# Patient Record
Sex: Female | Born: 1990 | Race: White | Hispanic: No | Marital: Single | Smoking: Never smoker
Health system: Southern US, Community
[De-identification: ages and names within clinical notes are randomized; demographics above are authoritative.]

---
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
## 2018-11-10 ENCOUNTER — Other Ambulatory Visit (HOSPITAL_COMMUNITY)
Admission: RE | Admit: 2018-11-10 | Discharge: 2018-11-10 | Disposition: A | Discharge status: Home / Self Care | Payer: Self-pay | Source: Ambulatory Visit | Attending: Family Medicine | Admitting: Family Medicine

## 2018-11-10 ENCOUNTER — Other Ambulatory Visit: Payer: Self-pay | Admitting: Family Medicine

## 2018-11-10 DIAGNOSIS — Z124 Encounter for screening for malignant neoplasm of cervix: Secondary | ICD-10-CM | POA: Insufficient documentation

## 2018-11-16 LAB — CYTOLOGY - PAP
Diagnosis: UNDETERMINED — AB
HPV: NOT DETECTED

## 2019-05-17 ENCOUNTER — Other Ambulatory Visit: Payer: Self-pay | Admitting: Family Medicine

## 2019-05-17 DIAGNOSIS — N6320 Unspecified lump in the left breast, unspecified quadrant: Secondary | ICD-10-CM

## 2019-05-25 ENCOUNTER — Other Ambulatory Visit: Payer: Self-pay

## 2019-11-18 ENCOUNTER — Other Ambulatory Visit (HOSPITAL_COMMUNITY)
Admission: RE | Admit: 2019-11-18 | Discharge: 2019-11-18 | Disposition: A | Discharge status: Home / Self Care | Payer: BLUE CROSS/BLUE SHIELD | Source: Ambulatory Visit | Attending: Family Medicine | Admitting: Family Medicine

## 2019-11-18 ENCOUNTER — Other Ambulatory Visit: Payer: Self-pay | Admitting: Family Medicine

## 2019-11-18 DIAGNOSIS — Z124 Encounter for screening for malignant neoplasm of cervix: Secondary | ICD-10-CM | POA: Insufficient documentation

## 2019-11-23 LAB — CYTOLOGY - PAP
Comment: NEGATIVE
Diagnosis: NEGATIVE
High risk HPV: NEGATIVE

## 2020-10-01 ENCOUNTER — Ambulatory Visit: Payer: Self-pay | Admitting: Surgery

## 2020-10-01 NOTE — H&P (Signed)
Savannah Campbell Appointment: 10/06/20 9:40 AM Location: [facility] Patient #: [id number] DOB: 1991-05-27 Married / Language: Savannah Campbell / Race: White Female  History of Present Illness Savannah Fus A. Deya Bigos MD; 2020-10-06 12:33 PM) Patient words: Patient presents for evaluation of gallstones. She was sent at the request of Dr. Corliss Blacker for evaluation of right flank and occasional right upper quadrant abdominal pain. The patient has had more consistent right flank pain and pain around to her scapula off and on. It is mild to moderate in intensity lasting minutes but no more than a few hours at worst. It comes and goes but is getting more frequent. No history of food intolerance or being made worse by eating. She does have a lot of bloating though and is seen her PCP for evaluation of this. No nausea, vomiting, fever or chills. No evidence of jaundice or yellowing of the eyes or skin.  The patient is a 30 year old female.   Past Surgical History Savannah Campbell, [place]; 10/06/2020 9:41 AM) No pertinent past surgical history  Diagnostic Studies History Savannah Campbell, [place]; 10/06/2020 9:41 AM) Colonoscopy never Mammogram within last year Pap Smear 1-5 years ago  Social History Savannah Campbell, [place]; Oct 06, 2020 9:41 AM) Alcohol use Occasional alcohol use. Caffeine use Coffee, Tea. No drug use Tobacco use Never smoker.  Family History Savannah Campbell, [place]; 2020/10/06 9:41 AM) Alcohol Abuse Family Members In General. Arthritis Father. Cerebrovascular Accident Family Members In General. Diabetes Mellitus Family Members In General. Hypertension Family Members In General. Melanoma Family Members In General. Migraine Headache Father, Mother, Sister. Thyroid problems Mother, Sister.  Pregnancy / Birth History Savannah Campbell, [place]; Oct 06, 2020 9:41 AM) Age at menarche 10 years. Contraceptive History Intrauterine device, Oral contraceptives. Gravida 2 Length (months) of breastfeeding >4 Maternal  age 78-25 Para 2 Regular periods  Other Problems Savannah Campbell, CMA; 10/06/2020 9:41 AM) Back Pain Cholelithiasis Migraine Headache Other disease, cancer, significant illness Thyroid Disease     Review of Systems Savannah Campbell CMA; 06-Oct-2020 9:41 AM) General Present- Fatigue. Not Present- Appetite Loss, Chills, Fever, Night Sweats, Weight Gain and Weight Loss. Skin Present- Dryness and New Lesions. Not Present- Change in Wart/Mole, Hives, Jaundice, Non-Healing Wounds, Rash and Ulcer. HEENT Present- Seasonal Allergies and Wears glasses/contact lenses. Not Present- Earache, Hearing Loss, Hoarseness, Nose Bleed, Oral Ulcers, Ringing in the Ears, Sinus Pain, Sore Throat, Visual Disturbances and Yellow Eyes. Respiratory Not Present- Bloody sputum, Chronic Cough, Difficulty Breathing, Snoring and Wheezing. Breast Not Present- Breast Mass, Breast Pain, Nipple Discharge and Skin Changes. Cardiovascular Not Present- Chest Pain, Difficulty Breathing Lying Down, Leg Cramps, Palpitations, Rapid Heart Rate, Shortness of Breath and Swelling of Extremities. Gastrointestinal Present- Bloating. Not Present- Abdominal Pain, Bloody Stool, Change in Bowel Habits, Chronic diarrhea, Constipation, Difficulty Swallowing, Excessive gas, Gets full quickly at meals, Hemorrhoids, Indigestion, Nausea, Rectal Pain and Vomiting. Female Genitourinary Not Present- Frequency, Nocturia, Painful Urination, Pelvic Pain and Urgency. Musculoskeletal Present- Back Pain and Joint Pain. Not Present- Joint Stiffness, Muscle Pain, Muscle Weakness and Swelling of Extremities. Neurological Present- Headaches. Not Present- Decreased Memory, Fainting, Numbness, Seizures, Tingling, Tremor, Trouble walking and Weakness. Psychiatric Not Present- Anxiety, Bipolar, Change in Sleep Pattern, Depression, Fearful and Frequent crying. Endocrine Not Present- Cold Intolerance, Excessive Hunger, Hair Changes, Heat Intolerance, Hot flashes and New  Diabetes. Hematology Not Present- Blood Thinners, Easy Bruising, Excessive bleeding, Gland problems, HIV and Persistent Infections.  Vitals Savannah Campbell CMA; 2020-10-06 9:42 AM) 2020-10-06 9:41 AM Weight: 197.38 lb Height: 67in Body Surface Area:  2.01 m Body Mass Index: 30.91 kg/m  Temp.: 42F  Pulse: 94 (Regular)  P.OX: 98% (Room air) BP: 118/68(Sitting, Left Arm, Standard)        Physical Exam (Savannah Szczesniak A. Bashar Milam MD; 10/01/2020 12:33 PM)  General Mental Status-Alert. General Appearance-Consistent with stated age. Hydration-Well hydrated. Voice-Normal.  Head and Neck Head-normocephalic, atraumatic with no lesions or palpable masses.  Eye Eyeball - Bilateral-Extraocular movements intact. Sclera/Conjunctiva - Bilateral-No scleral icterus.  Chest and Lung Exam Chest and lung exam reveals -quiet, even and easy respiratory effort with no use of accessory muscles and on auscultation, normal breath sounds, no adventitious sounds and normal vocal resonance. Inspection Chest Wall - Normal. Back - normal.  Cardiovascular Cardiovascular examination reveals -on palpation PMI is normal in location and amplitude, no palpable S3 or S4. Normal cardiac borders., normal heart sounds, regular rate and rhythm with no murmurs, carotid auscultation reveals no bruits and normal pedal pulses bilaterally.  Abdomen Inspection Inspection of the abdomen reveals - No Hernias. Skin - Scar - no surgical scars. Palpation/Percussion Palpation and Percussion of the abdomen reveal - Soft, Non Tender, No Rebound tenderness, No Rigidity (guarding) and No hepatosplenomegaly. Auscultation Auscultation of the abdomen reveals - Bowel sounds normal.  Neurologic Neurologic evaluation reveals -alert and oriented x 3 with no impairment of recent or remote memory. Mental Status-Normal.  Musculoskeletal Normal Exam - Left-Upper Extremity Strength Normal and Lower Extremity Strength  Normal. Normal Exam - Right-Upper Extremity Strength Normal, Lower Extremity Weakness.    Assessment & Plan (Savannah Berkovich A. Phoenyx Melka MD; 10/01/2020 12:34 PM)  SYMPTOMATIC CHOLELITHIASIS (K80.20) Impression: She becomes symptomatic. Her symptoms are mild. I discussed laparoscopic cholecystectomy with her with the pros and cons. I explained that surgery could be done at a later time depending on #feels that she could be managed with dietary restrictions or consider surgery since she is symptomatic with a 1.6 and her gallstone ULTRASOUND. HER LIVER FUNCTION IS NORMAL AND HER COMMON BILE DUCT IS 3 MM ON HER MOST RECENT ULTRASOUND. SHE WOULD LIKE TO PROCEED WITH SURGERY SINCE HER MOTHER HAD ACUTE CHOLECYSTITIS AND A PROLONGED HOSPITAL STAY. The procedure has been discussed with the patient. Risks of laparoscopic cholecystectomy include bleeding, infection, bile duct injury, leak, death, open surgery, diarrhea, other surgery, organ injury, blood vessel injury, DVT, and additional care.   Total time 30 minutes  Current Plans You are being scheduled for surgery- Our schedulers will call you.  You should hear from our office's scheduling department within 5 working days about the location, date, and time of surgery. We try to make accommodations for patient's preferences in scheduling surgery, but sometimes the OR schedule or the surgeon's schedule prevents [place] from making those accommodations.  If you have not heard from our office [phone]) in 5 working days, call the office and ask for your surgeon's nurse.  If you have other questions about your diagnosis, plan, or surgery, call the office and ask for your surgeon's nurse.  Pt Education - Laparoscopic Cholecystectomy: gallbladder Pt Education - [facility] Good Bowel Health Pt Education - [facility] Laparosopic Post Op HCI The anatomy & physiology of hepatobiliary & pancreatic function was discussed. The pathophysiology of gallbladder dysfunction was  discussed. Natural history risks without surgery was discussed. I feel the risks of no intervention will lead to serious problems that outweigh the operative risks; therefore, I recommended cholecystectomy to remove the pathology. I explained laparoscopic techniques with possible need for an open approach. Probable cholangiogram to evaluate the bilary tract was  explained as well.  Risks such as bleeding, infection, abscess, leak, injury to other organs, need for further treatment, heart attack, death, and other risks were discussed. I noted a good likelihood this will help address the problem. Possibility that this will not correct all abdominal symptoms was explained. Goals of post-operative recovery were discussed as well. We will work to minimize complications. An educational handout further explaining the pathology and treatment options was given as well. Questions were answered. The patient expresses understanding & wishes to proceed with surgery.

## 2021-06-27 ENCOUNTER — Other Ambulatory Visit: Payer: Self-pay | Admitting: Family Medicine

## 2021-06-27 DIAGNOSIS — R1011 Right upper quadrant pain: Secondary | ICD-10-CM

## 2021-06-28 ENCOUNTER — Ambulatory Visit
Admission: RE | Admit: 2021-06-28 | Discharge: 2021-06-28 | Disposition: A | Discharge status: Home / Self Care | Payer: BLUE CROSS/BLUE SHIELD | Source: Ambulatory Visit | Attending: Family Medicine | Admitting: Family Medicine

## 2021-06-28 DIAGNOSIS — R1011 Right upper quadrant pain: Secondary | ICD-10-CM

## 2021-12-04 ENCOUNTER — Other Ambulatory Visit: Payer: Self-pay | Admitting: Surgery

## 2022-06-04 ENCOUNTER — Other Ambulatory Visit: Payer: Self-pay | Admitting: Family Medicine

## 2022-06-04 DIAGNOSIS — K760 Fatty (change of) liver, not elsewhere classified: Secondary | ICD-10-CM

## 2022-06-24 ENCOUNTER — Other Ambulatory Visit: Payer: BLUE CROSS/BLUE SHIELD

## 2022-07-09 ENCOUNTER — Inpatient Hospital Stay: Admission: RE | Admit: 2022-07-09 | Payer: BLUE CROSS/BLUE SHIELD | Source: Ambulatory Visit

## 2022-11-27 ENCOUNTER — Other Ambulatory Visit: Payer: Self-pay | Admitting: Family Medicine

## 2022-11-27 ENCOUNTER — Ambulatory Visit
Admission: RE | Admit: 2022-11-27 | Discharge: 2022-11-27 | Disposition: A | Discharge status: Home / Self Care | Payer: BLUE CROSS/BLUE SHIELD | Source: Ambulatory Visit | Attending: Family Medicine | Admitting: Family Medicine

## 2022-11-27 ENCOUNTER — Encounter: Payer: Self-pay | Admitting: Family Medicine

## 2022-11-27 DIAGNOSIS — Z872 Personal history of diseases of the skin and subcutaneous tissue: Secondary | ICD-10-CM

## 2023-03-30 IMAGING — US US ABDOMEN LIMITED
1 series · 13 of 25 positions shown · non-contrast
Comparison: September 19, 2020.

CLINICAL DATA: A 30-year-old female presents with RIGHT upper
quadrant pain for 2 days.

EXAM:
ULTRASOUND ABDOMEN LIMITED RIGHT UPPER QUADRANT

[Series 1: us abdomen limited · 0.25mm/px · 13 of 52 slices shown]
[im 1/52]
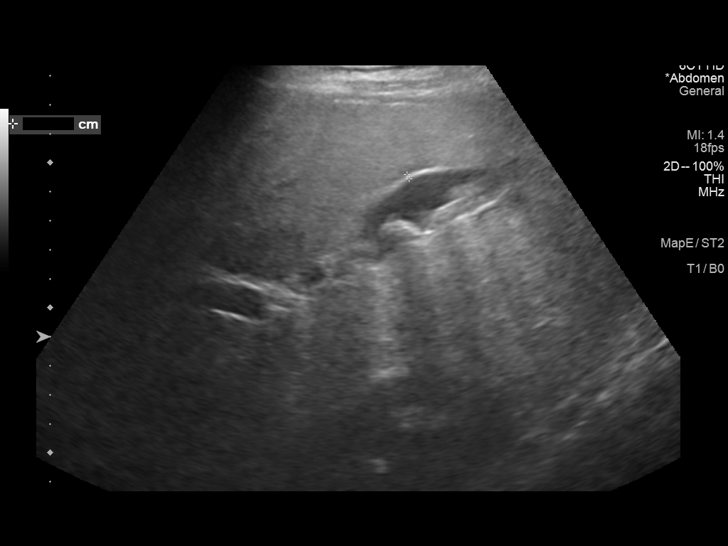
[im 5/52]
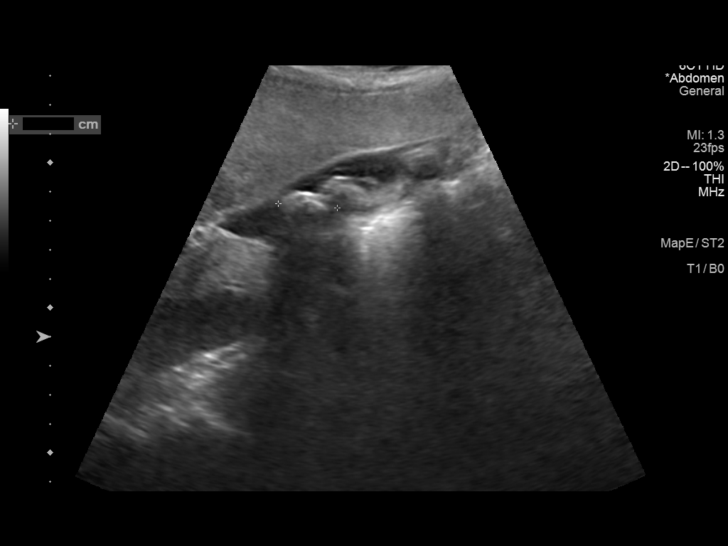
[im 9/52]
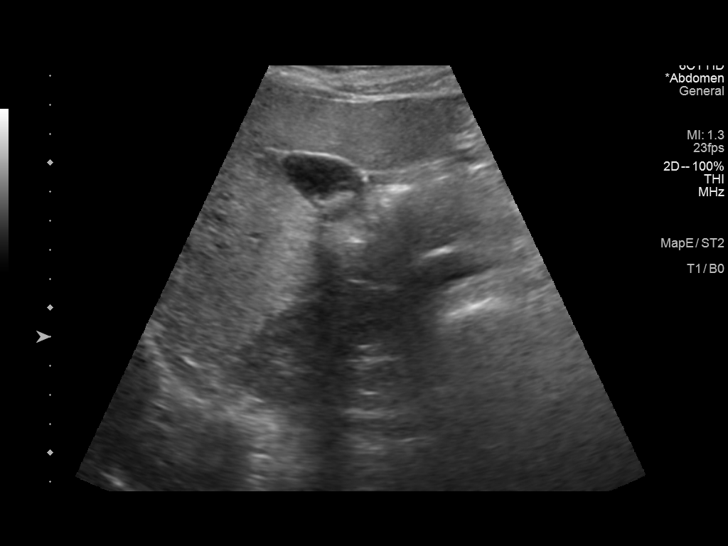
[im 13/52]
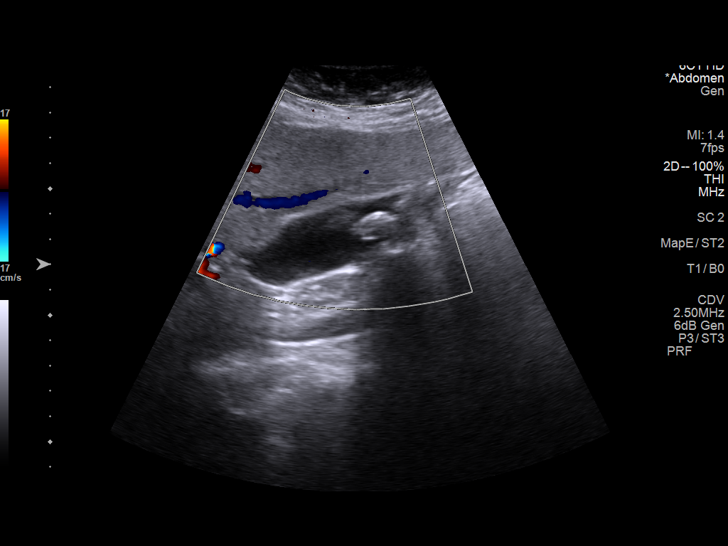
[im 18/52]
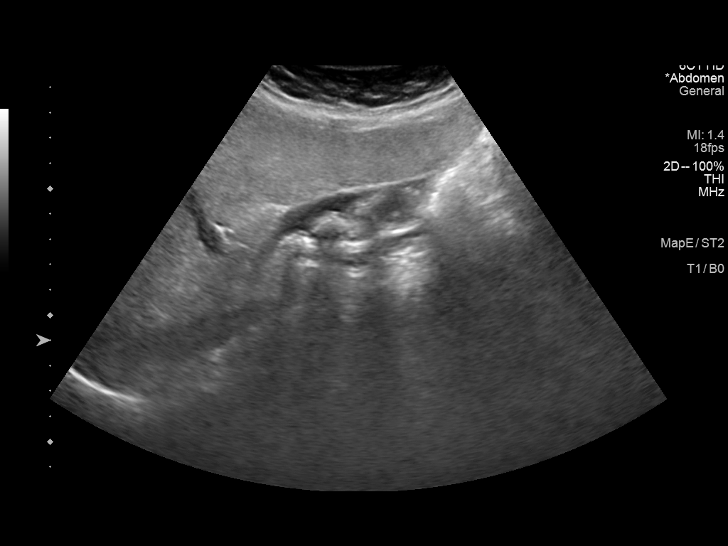
[im 22/52]
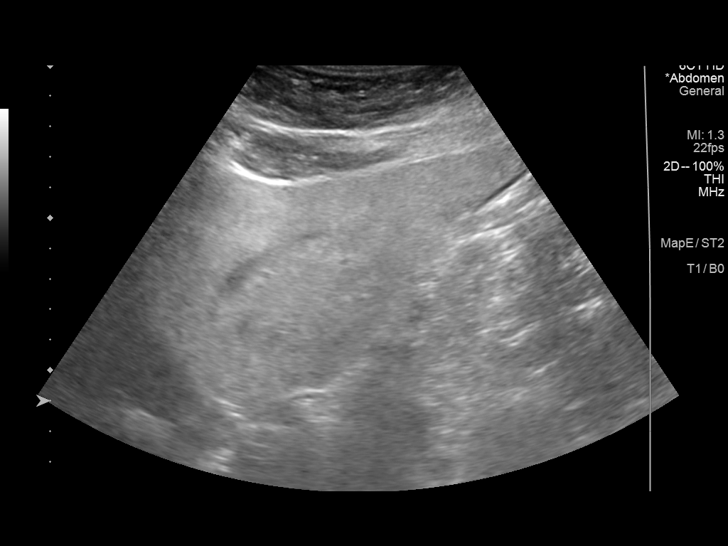
[im 26/52]
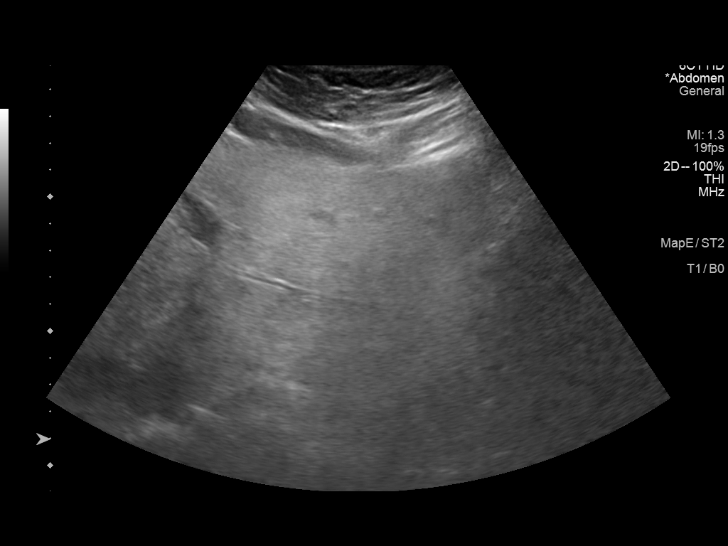
[im 30/52]
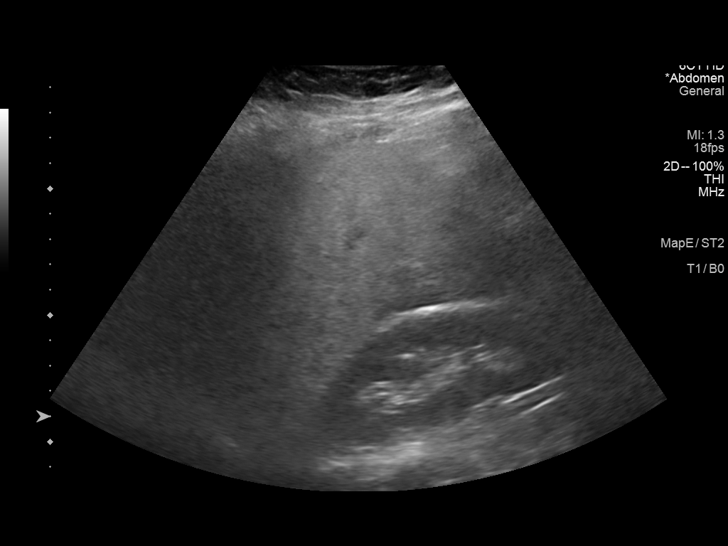
[im 35/52]
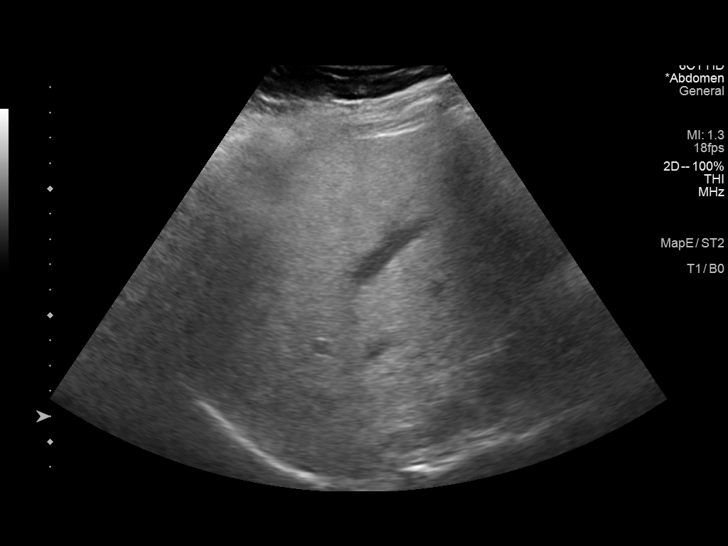
[im 39/52]
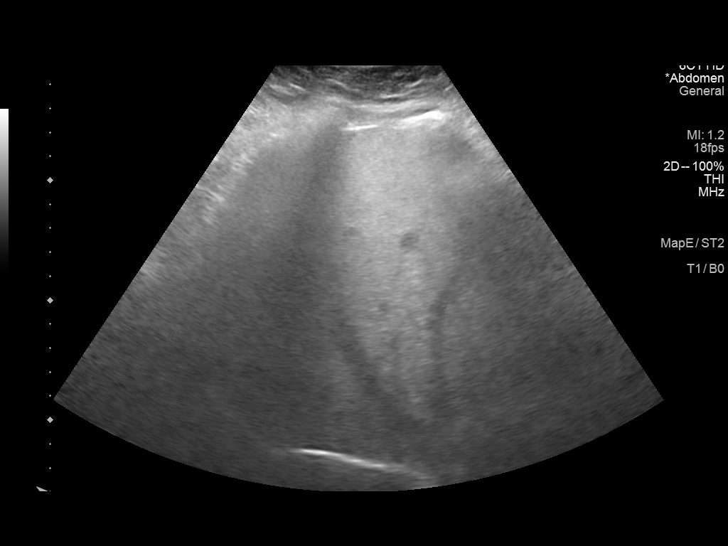
[im 43/52]
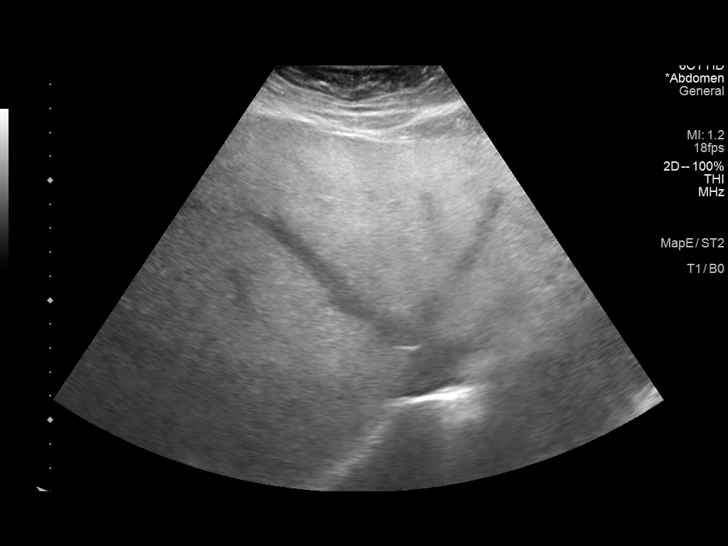
[im 47/52]
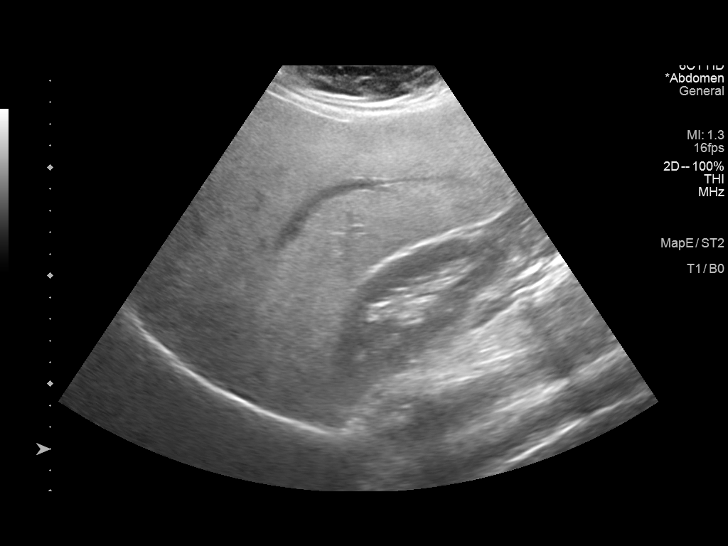
[im 52/52]
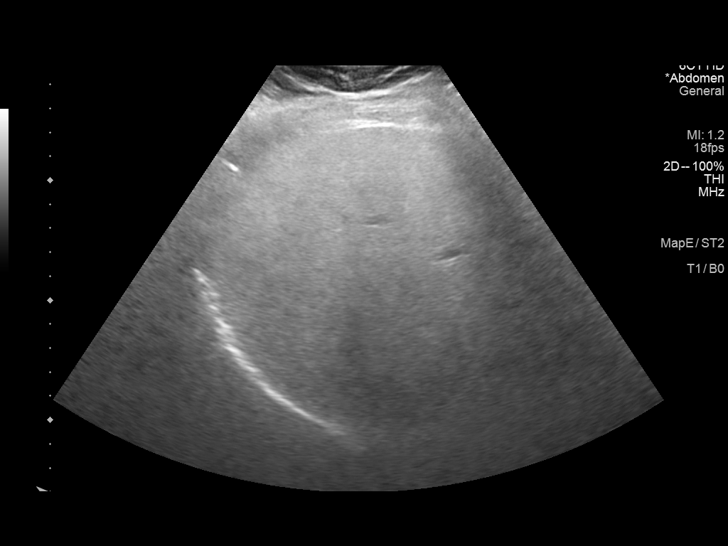

[13 of 25 positions shown; findings below may reference images not displayed]

FINDINGS: Gallbladder:

No signs of gallbladder wall thickening or pericholecystic fluid.
Numerous gallstones within the gallbladder lumen largest
approximately 2 cm. These layer dependently. No reported tenderness
over the gallbladder.

Common bile duct:

Diameter: 2.4 mm

Liver:

Markedly increased hepatic echogenicity without visible lesion
accounting for limitations due to attenuation of the ultrasound beam
in the setting of marked hepatic steatosis. Vague decreased
echogenicity about the porta hepatis, a classic location for
variable fat deposition. Portal vein is patent on color Doppler
imaging with normal direction of blood flow towards the liver.

Other: No ascites visible.
IMPRESSION: Marked hepatic steatosis with variable fat deposition about the
porta hepatis.

Cholelithiasis without sonographic evidence of acute cholecystitis
at this time. If there is continued pain or concern for
cholecystitis HIDA scan may add specificity as warranted.

Normal caliber of the common bile duct.

## 2024-04-22 ENCOUNTER — Encounter (HOSPITAL_BASED_OUTPATIENT_CLINIC_OR_DEPARTMENT_OTHER): Payer: Self-pay

## 2024-04-22 ENCOUNTER — Other Ambulatory Visit (HOSPITAL_BASED_OUTPATIENT_CLINIC_OR_DEPARTMENT_OTHER): Payer: Self-pay

## 2024-04-22 ENCOUNTER — Ambulatory Visit (HOSPITAL_BASED_OUTPATIENT_CLINIC_OR_DEPARTMENT_OTHER)
Admission: EM | Admit: 2024-04-22 | Discharge: 2024-04-22 | Disposition: A | Discharge status: Home / Self Care | Attending: Family Medicine | Admitting: Family Medicine

## 2024-04-22 DIAGNOSIS — R051 Acute cough: Secondary | ICD-10-CM

## 2024-04-22 DIAGNOSIS — J019 Acute sinusitis, unspecified: Secondary | ICD-10-CM

## 2024-04-22 MED ORDER — AMOXICILLIN 500 MG PO CAPS
500.0000 mg | ORAL_CAPSULE | Freq: Three times a day (TID) | ORAL | 0 refills | Status: AC
  Filled 2024-04-22: qty 21, 7d supply, fill #0

## 2024-04-22 NOTE — Discharge Instructions (Signed)
 Treating for neck respiratory infection.  Take the amoxicillin  as prescribed.  He can continue over-the-counter medications to include sinus medications, Mucinex, Sudafed, Tylenol.  Follow-up as needed

## 2024-04-22 NOTE — ED Triage Notes (Signed)
 Initial symptoms of cough, sinus congestion, sore throat approx 10 days ago. Felt better over the weekend then symptoms seemed to just not clear up. States feels as if her chest is congested. Occ cough. Feeling pressure to sinuses.

## 2024-04-22 NOTE — ED Provider Notes (Signed)
 [facility]    CSN: [id number] Arrival date & time: 04/22/24  1456      History   Chief Complaint Chief Complaint  Patient presents with   Sinus congestion   Sore Throat   Cough    HPI Savannah Campbell is a 33 y.o. female.   Patient is a 33 year old female who presents today with  cough, sinus congestion, sore throat approx 10 days ago. Felt better over the weekend then symptoms seemed to just not clear up. States feels as if her chest is congested. Occ cough. Feeling pressure to sinuses.    Sore Throat  Cough   History reviewed. No pertinent past medical history.  There are no active problems to display for this patient.   History reviewed. No pertinent surgical history.  OB History   No obstetric history on file.      Home Medications    Prior to Admission medications   Medication Sig Start Date End Date Taking? Authorizing Provider  amoxicillin  (AMOXIL ) 500 MG capsule Take 1 capsule (500 mg total) by mouth 3 (three) times daily. 04/22/24  Yes Acea Yagi A, FNP  levothyroxine (SYNTHROID) 50 MCG tablet Take 50 mcg by mouth daily before breakfast.  1 and one half tablet 4 x week and 1 tablet 3x week 09/24/20  Yes [provider]    Family History Family History  Problem Relation Age of Onset   Lung cancer Paternal Grandfather    BRCA 1/2 Neg Hx    Breast cancer Neg Hx     Social History Social History   Tobacco Use   Smoking status: Never   Smokeless tobacco: Never  Vaping Use   Vaping status: Never Used     Allergies   Patient has no known allergies.   Review of Systems Review of Systems  Respiratory:  Positive for cough.      Physical Exam Triage Vital Signs ED Triage Vitals  Encounter Vitals Group     BP 04/22/24 1515 103/78     Girls Systolic BP Percentile --      Girls Diastolic BP Percentile --      Boys Systolic BP Percentile --      Boys Diastolic BP Percentile --      Pulse Rate 04/22/24 1515 75      Resp 04/22/24 1515 20     Temp 04/22/24 1515 98.2 F (36.8 C)     Temp Source 04/22/24 1515 Oral     SpO2 04/22/24 1515 97 %     Weight --      Height --      Head Circumference --      Peak Flow --      Pain Score 04/22/24 1517 3     Pain Loc --      Pain Education --      Exclude from Growth Chart --    No data found.  Updated Vital Signs BP 103/78 (BP Location: Right Arm)   Pulse 75   Temp 98.2 F (36.8 C) (Oral)   Resp 20   LMP 04/13/2024   SpO2 97%   Visual Acuity Right Eye Distance:   Left Eye Distance:   Bilateral Distance:    Right Eye Near:   Left Eye Near:    Bilateral Near:     Physical Exam Constitutional:      General: She is not in acute distress.    Appearance: Normal appearance. She is not ill-appearing, toxic-appearing or  diaphoretic.  HENT:     Head: Normocephalic and atraumatic.     Right Ear: Tympanic membrane and ear canal normal.     Left Ear: Tympanic membrane and ear canal normal.     Nose: Congestion present.     Mouth/Throat:     Pharynx: Oropharynx is clear.  Eyes:     Conjunctiva/sclera: Conjunctivae normal.  Cardiovascular:     Rate and Rhythm: Normal rate and regular rhythm.     Pulses: Normal pulses.     Heart sounds: Normal heart sounds.  Pulmonary:     Effort: Pulmonary effort is normal.     Breath sounds: Examination of the right-upper field reveals decreased breath sounds. Examination of the right-middle field reveals decreased breath sounds. Examination of the right-lower field reveals decreased breath sounds. Decreased breath sounds present.  Skin:    General: Skin is warm and dry.  Neurological:     Mental Status: She is alert.  Psychiatric:        Mood and Affect: Mood normal.      UC Treatments / Results  Labs (all labs ordered are listed, but only abnormal results are displayed) Labs Reviewed - No data to display  EKG   Radiology No results found.  Procedures Procedures (including critical care  time)  Medications Ordered in UC Medications - No data to display  Initial Impression / Assessment and Plan / UC Course  I have reviewed the triage vital signs and the nursing notes.  Pertinent labs & imaging results that were available during my care of the patient were reviewed by me and considered in my medical decision making (see chart for details).     Acute cough and sinusitis.  Patient symptoms started approximate 10 days ago and now has moved into her chest.  She has decreased sounds on the right side of her lungs.  Concerned about respiratory tract infection.  She may also have a sinus infection.  Will go ahead and treat with amoxicillin  at this time.  Recommend over-the-counter medications to include sinus medications, Mucinex, Sudafed and Tylenol.  Follow-up as needed Final Clinical Impressions(s) / UC Diagnoses   Final diagnoses:  Acute cough  Acute non-recurrent sinusitis, unspecified location     Discharge Instructions      Treating for neck respiratory infection.  Take the amoxicillin  as prescribed.  He can continue over-the-counter medications to include sinus medications, Mucinex, Sudafed, Tylenol.  Follow-up as needed    ED Prescriptions     Medication Sig Dispense Auth. Provider   amoxicillin  (AMOXIL ) 500 MG capsule Take 1 capsule (500 mg total) by mouth 3 (three) times daily. 21 capsule Adah Wilbert LABOR, FNP      PDMP not reviewed this encounter.   Adah Wilbert LABOR, FNP 04/22/24 1755
